# Patient Record
Sex: Female | Born: 1940 | Race: Black or African American | Hispanic: No | Smoking: Never smoker
Health system: Southern US, Community
[De-identification: ages and names within clinical notes are randomized; demographics above are authoritative.]

## PROBLEM LIST (undated history)

## (undated) DIAGNOSIS — G459 Transient cerebral ischemic attack, unspecified: Secondary | ICD-10-CM

## (undated) DIAGNOSIS — I10 Essential (primary) hypertension: Secondary | ICD-10-CM

## (undated) HISTORY — PX: OTHER SURGICAL HISTORY: SHX169

---

## 2016-09-11 ENCOUNTER — Encounter (HOSPITAL_COMMUNITY): Payer: Self-pay | Admitting: Emergency Medicine

## 2016-09-11 ENCOUNTER — Emergency Department (HOSPITAL_COMMUNITY)
Admission: EM | Admit: 2016-09-11 | Discharge: 2016-09-11 | Disposition: A | Discharge status: Home / Self Care | Payer: Medicare Other | Attending: Emergency Medicine | Admitting: Emergency Medicine

## 2016-09-11 ENCOUNTER — Emergency Department (HOSPITAL_COMMUNITY): Payer: Medicare Other

## 2016-09-11 DIAGNOSIS — I10 Essential (primary) hypertension: Secondary | ICD-10-CM | POA: Insufficient documentation

## 2016-09-11 DIAGNOSIS — I951 Orthostatic hypotension: Secondary | ICD-10-CM

## 2016-09-11 DIAGNOSIS — Z8673 Personal history of transient ischemic attack (TIA), and cerebral infarction without residual deficits: Secondary | ICD-10-CM | POA: Insufficient documentation

## 2016-09-11 DIAGNOSIS — R55 Syncope and collapse: Secondary | ICD-10-CM

## 2016-09-11 DIAGNOSIS — Z79899 Other long term (current) drug therapy: Secondary | ICD-10-CM | POA: Insufficient documentation

## 2016-09-11 DIAGNOSIS — Z7982 Long term (current) use of aspirin: Secondary | ICD-10-CM | POA: Diagnosis not present

## 2016-09-11 HISTORY — DX: Transient cerebral ischemic attack, unspecified: G45.9

## 2016-09-11 HISTORY — DX: Essential (primary) hypertension: I10

## 2016-09-11 LAB — CBC
HEMATOCRIT: 28.7 % — AB (ref 36.0–46.0)
HEMOGLOBIN: 9.3 g/dL — AB (ref 12.0–15.0)
MCH: 29.5 pg (ref 26.0–34.0)
MCHC: 32.4 g/dL (ref 30.0–36.0)
MCV: 91.1 fL (ref 78.0–100.0)
Platelets: 130 10*3/uL — ABNORMAL LOW (ref 150–400)
RBC: 3.15 MIL/uL — AB (ref 3.87–5.11)
RDW: 14.4 % (ref 11.5–15.5)
WBC: 5 10*3/uL (ref 4.0–10.5)

## 2016-09-11 LAB — BASIC METABOLIC PANEL
ANION GAP: 10 (ref 5–15)
BUN: 15 mg/dL (ref 6–20)
CALCIUM: 9.1 mg/dL (ref 8.9–10.3)
CO2: 28 mmol/L (ref 22–32)
Chloride: 100 mmol/L — ABNORMAL LOW (ref 101–111)
Creatinine, Ser: 1.19 mg/dL — ABNORMAL HIGH (ref 0.44–1.00)
GFR, EST AFRICAN AMERICAN: 51 mL/min — AB (ref >60)
GFR, EST NON AFRICAN AMERICAN: 44 mL/min — AB (ref >60)
Glucose, Bld: 139 mg/dL — ABNORMAL HIGH (ref 65–99)
POTASSIUM: 3.6 mmol/L (ref 3.5–5.1)
SODIUM: 138 mmol/L (ref 135–145)

## 2016-09-11 MED ORDER — SODIUM CHLORIDE 0.9 % IV BOLUS (SEPSIS)
1000.0000 mL | Freq: Once | INTRAVENOUS | Status: AC
  Administered 2016-09-11: 1000 mL via INTRAVENOUS

## 2016-09-11 NOTE — ED Notes (Signed)
ED Provider at bedside. 

## 2016-09-11 NOTE — ED Notes (Signed)
Bed: WA17 Expected date:  Expected time:  Means of arrival:  Comments: EMS- 75yo F, syncope

## 2016-09-11 NOTE — ED Notes (Signed)
Pt attempted urine sample, was unsuccessful

## 2016-09-11 NOTE — ED Triage Notes (Signed)
Pt from son's home. Pt was taking a shower with an aide when she had a near syncopal episode. Was assisted to sit on toilet at time of near syncope. Pt denies any pain or SOB now or at time of episode. EMS noted that pt was orthostatic upon standing (systolic BP decreased from 140 when sitting to 120 when standing). Pt alert and oriented. Has had other near syncopal episodes at home. Hx of AAA surgery in August. Has not had breakfast or morning medications this am.

## 2016-09-11 NOTE — ED Notes (Signed)
Pt returned from xray

## 2016-09-11 NOTE — ED Notes (Signed)
Pt ambulatory in hallway 

## 2016-09-11 NOTE — ED Notes (Signed)
I made the nurse aware I could not collect the labs

## 2016-09-11 NOTE — ED Notes (Signed)
I attempted to collect labs twice and was unsuccessful 

## 2016-09-11 NOTE — Progress Notes (Signed)
Pt active with [facility] for nursing and PT per Jorja Loaim from [facility]

## 2016-09-11 NOTE — ED Provider Notes (Signed)
[facility]-EMERGENCY DEPT Provider Note   CSN: [id number] Arrival date & time: 09/11/16  [id number]     History   Chief Complaint Chief Complaint  Patient presents with  . Near Syncope    HPI Judith Everett is a 75 y.o. female.  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   Late August had AAA surgery chest[place]abdomen per family in [place] at [facility] with Marcell BarlowYamaguchi    Past Medical History:  Diagnosis Date  . Hypertension   . TIA (transient ischemic attack)    2015?    There are no active problems to display for this patient.   Past Surgical History:  Procedure Laterality Date  . AAA surgery      OB History    No data available       Home Medications    Prior to Admission medications   Medication Sig Start Date End Date Taking? Authorizing Provider  acetaminophen (TYLENOL) 500 MG tablet Take 500 mg by mouth every 6 (six) hours as needed for mild pain.   Yes Historical Provider, MD  aspirin EC 81 MG tablet Take 81 mg by mouth daily.   Yes Historical Provider, MD  atorvastatin (LIPITOR) 20 MG tablet Take 20 mg by mouth daily.   Yes Historical Provider, MD  gabapentin (NEURONTIN) 100 MG capsule Take 100 mg by mouth 3 (three) times daily.   Yes Historical Provider, MD  metoprolol succinate (TOPROL-XL) 25 MG 24 hr tablet Take 25 mg by mouth every morning.   Yes Historical Provider, MD    Family History History  reviewed. No pertinent family history.  Social History Social History  Substance Use Topics  . Smoking status: Never Smoker  . Smokeless tobacco: Never Used  . Alcohol use No     Allergies   Other   Review of Systems Review of Systems  Constitutional: Negative for fever.  HENT: Negative for sore throat.   Eyes: Negative for visual disturbance.  Respiratory: Negative for cough and shortness of breath.   Cardiovascular: Negative for chest pain.  Gastrointestinal: Positive for constipation. Negative for abdominal pain, blood in stool, diarrhea, nausea and vomiting.  Genitourinary: Negative for difficulty urinating.  Musculoskeletal: Positive for back pain. Negative for neck pain.  Skin: Negative for rash.  Neurological: Positive for syncope and light-headedness. Negative for facial asymmetry, speech difficulty, weakness, numbness and headaches.     Physical Exam Updated Vital Signs BP 176/97   Pulse 76   Temp 98 F (36.7 C) (Oral)   Resp 16   SpO2 93%   Physical Exam  Constitutional: She is oriented to person, place, and time. She appears well-developed and well-nourished. No distress.  HENT:  Head: Normocephalic and atraumatic.  Eyes: Conjunctivae and EOM are normal.  Neck: Normal range of motion.  Cardiovascular: Normal rate, regular rhythm, normal heart sounds and intact distal pulses.  Exam reveals no gallop and no  friction rub.   No murmur heard. Pulmonary/Chest: Effort normal and breath sounds normal. No respiratory distress. She has no wheezes. She has no rales.  Lung sounds diminished on the left  Abdominal: Soft. She exhibits no distension. There is no tenderness. There is no guarding.  Musculoskeletal: She exhibits tenderness (left thoracic paraspinal muscles). She exhibits no edema.  Neurological: She is alert and oriented to person, place, and time.  Skin: Skin is warm and dry. No rash noted. She is not diaphoretic. No erythema.  Nursing note and vitals  reviewed.    ED Treatments / Results  Labs (all labs ordered are listed, but only abnormal results are displayed) Labs Reviewed  BASIC METABOLIC PANEL - Abnormal; Notable for the following:       Result Value   Chloride 100 (*)    Glucose, Bld 139 (*)    Creatinine, Ser 1.19 (*)    GFR calc non Af Amer 44 (*)    GFR calc Af Amer 51 (*)    All other components within normal limits  CBC - Abnormal; Notable for the following:    RBC 3.15 (*)    Hemoglobin 9.3 (*)    HCT 28.7 (*)    Platelets 130 (*)    All other components within normal limits  URINALYSIS, ROUTINE W REFLEX MICROSCOPIC  CBG MONITORING, ED    EKG  EKG Interpretation  Date/Time:  Thursday September 11 2016 10:10:16 EST Ventricular Rate:  78 PR Interval:    QRS Duration: 90 QT Interval:  391 QTC Calculation: 446 R Axis:   37 Text Interpretation:  Sinus rhythm Multiple ventricular premature complexes Probable left atrial enlargement Anteroseptal infarct, old No prior ECG available Confirmed by [facility] MD, Anetria Harwick ([id number]) on 09/11/2016 5:56:19 PM       Radiology Dg Chest 2 View  Result Date: 09/11/2016 CLINICAL DATA:  Dizziness, syncope EXAM: CHEST  2 VIEW COMPARISON:  None. FINDINGS: Cardiomegaly. 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 Left retrocardiac opacity, likely compressive atelectasis. Right lung is clear. No visible effusions. No acute bony abnormality. IMPRESSION: Stent graft repair of the thoracic aorta. Cardiomegaly. Left retrocardiac density likely reflects atelectasis. Electronically Signed   By: Charlett Nose M.D.   On: 09/11/2016 12:38    Procedures Procedures (including critical care time)  Medications Ordered in ED Medications  sodium chloride 0.9 % bolus 1,000 mL (0 mLs Intravenous Stopped 09/11/16 1542)     Initial Impression / Assessment and Plan / ED Course  I have reviewed the triage vital signs and the nursing notes.  Pertinent labs & imaging  results that were available during my care of the patient were reviewed by me and considered in my medical decision making (see chart for details).  Clinical Course    75 year old female with a history of aortic aneurysm repair in August with thoracoabdominal stent placed by Dr. Marcell Barlow at [facility], CKD stage III, pericardial effusion, htn presents with concern for syncope.  EKG without significant prolonged QTc, no delta waves, no acute ST changes, no Brugada. Chest x-ray shows cardiomegaly and thoracic stent placement. Mom unable to review previous x-rays, reviewed records from [facility] in [place] where radiology reports prominent heart and mediastinum, prior pericardial effusion seen on CT. No EKG findings or vital sign findings to suggest tamponade, and have low suspicion for this given brief episode of lightheadedness with resolution of symptoms at this time. No symptoms to suggest pneumonia.  Records obtained  and reviewed from [facility] also show patient had previous anemia with hemoglobin of 8.6, with hemoglobin today 9.1 which is improved. Patient denies any other infectious symptoms, including no urinary symptoms. No nausea vomiting, no diarrhea. No black or bloody stool. No chest pain or dyspnea and doubt PE/MI/dissection/aneurysm rupture.   Given clinical setting a patient not having breakfast or water prior to taking a shower this morning, feel her lightheadedness and syncope is likely secondary to dehydration. Patient initially had orthostatic vital sign changes in the emergency department, was given a liter of normal saline with resolution of these changes.  She is asymptomatic in ED.  I feel she is stable for close outpatient follow up and discussed return precautions with her and her son in detail. Patient discharged in stable condition with understanding of reasons to return.   Final Clinical Impressions(s) / ED Diagnoses   Final diagnoses:  Syncope,  unspecified syncope type  Orthostatic hypotension, likely secondary to dehydration, resolved    New Prescriptions Discharge Medication List as of 09/11/2016  3:51 PM       Alvira MondayErin Larine Fielding, MD 09/11/16 [id number]

## 2016-09-12 LAB — CBG MONITORING, ED: GLUCOSE-CAPILLARY: 157 mg/dL — AB (ref 65–99)

## 2017-01-20 ENCOUNTER — Encounter (HOSPITAL_COMMUNITY): Payer: Self-pay

## 2017-01-20 ENCOUNTER — Emergency Department (HOSPITAL_COMMUNITY): Payer: Medicare Other

## 2017-01-20 ENCOUNTER — Emergency Department (HOSPITAL_COMMUNITY)
Admission: EM | Admit: 2017-01-20 | Discharge: 2017-01-20 | Disposition: A | Discharge status: Other Acute Inpatient Hospital | Payer: Medicare Other | Attending: Emergency Medicine | Admitting: Emergency Medicine

## 2017-01-20 DIAGNOSIS — Z7982 Long term (current) use of aspirin: Secondary | ICD-10-CM | POA: Diagnosis not present

## 2017-01-20 DIAGNOSIS — Z8673 Personal history of transient ischemic attack (TIA), and cerebral infarction without residual deficits: Secondary | ICD-10-CM | POA: Diagnosis not present

## 2017-01-20 DIAGNOSIS — T82898A Other specified complication of vascular prosthetic devices, implants and grafts, initial encounter: Secondary | ICD-10-CM | POA: Diagnosis not present

## 2017-01-20 DIAGNOSIS — IMO0002 Reserved for concepts with insufficient information to code with codable children: Secondary | ICD-10-CM

## 2017-01-20 DIAGNOSIS — Z79899 Other long term (current) drug therapy: Secondary | ICD-10-CM | POA: Insufficient documentation

## 2017-01-20 DIAGNOSIS — R1013 Epigastric pain: Secondary | ICD-10-CM | POA: Diagnosis not present

## 2017-01-20 DIAGNOSIS — I169 Hypertensive crisis, unspecified: Secondary | ICD-10-CM | POA: Insufficient documentation

## 2017-01-20 DIAGNOSIS — T82330A Leakage of aortic (bifurcation) graft (replacement), initial encounter: Secondary | ICD-10-CM

## 2017-01-20 DIAGNOSIS — Y69 Unspecified misadventure during surgical and medical care: Secondary | ICD-10-CM | POA: Diagnosis not present

## 2017-01-20 DIAGNOSIS — R072 Precordial pain: Secondary | ICD-10-CM | POA: Diagnosis present

## 2017-01-20 LAB — CBC WITH DIFFERENTIAL/PLATELET
BASOS PCT: 0 %
Basophils Absolute: 0 10*3/uL (ref 0.0–0.1)
EOS ABS: 0.1 10*3/uL (ref 0.0–0.7)
Eosinophils Relative: 1 %
HEMATOCRIT: 29.6 % — AB (ref 36.0–46.0)
HEMOGLOBIN: 9.2 g/dL — AB (ref 12.0–15.0)
LYMPHS ABS: 1.8 10*3/uL (ref 0.7–4.0)
LYMPHS PCT: 23 %
MCH: 29.6 pg (ref 26.0–34.0)
MCHC: 31.1 g/dL (ref 30.0–36.0)
MCV: 95.2 fL (ref 78.0–100.0)
Monocytes Absolute: 0.8 10*3/uL (ref 0.1–1.0)
Monocytes Relative: 10 %
NEUTROS ABS: 5.2 10*3/uL (ref 1.7–7.7)
Neutrophils Relative %: 66 %
Platelets: 98 10*3/uL — ABNORMAL LOW (ref 150–400)
RBC: 3.11 MIL/uL — ABNORMAL LOW (ref 3.87–5.11)
RDW: 14.1 % (ref 11.5–15.5)
WBC: 7.9 10*3/uL (ref 4.0–10.5)

## 2017-01-20 LAB — COMPREHENSIVE METABOLIC PANEL
ALK PHOS: 73 U/L (ref 38–126)
ALT: 12 U/L — ABNORMAL LOW (ref 14–54)
ANION GAP: 10 (ref 5–15)
AST: 22 U/L (ref 15–41)
Albumin: 4.2 g/dL (ref 3.5–5.0)
BILIRUBIN TOTAL: 0.8 mg/dL (ref 0.3–1.2)
BUN: 20 mg/dL (ref 6–20)
CALCIUM: 9.4 mg/dL (ref 8.9–10.3)
CO2: 27 mmol/L (ref 22–32)
Chloride: 104 mmol/L (ref 101–111)
Creatinine, Ser: 1.4 mg/dL — ABNORMAL HIGH (ref 0.44–1.00)
GFR calc non Af Amer: 36 mL/min — ABNORMAL LOW (ref >60)
GFR, EST AFRICAN AMERICAN: 41 mL/min — AB (ref >60)
Glucose, Bld: 168 mg/dL — ABNORMAL HIGH (ref 65–99)
Potassium: 3.6 mmol/L (ref 3.5–5.1)
Sodium: 141 mmol/L (ref 135–145)
TOTAL PROTEIN: 7.6 g/dL (ref 6.5–8.1)

## 2017-01-20 LAB — ABO/RH: ABO/RH(D): O POS

## 2017-01-20 LAB — TYPE AND SCREEN
ABO/RH(D): O POS
ANTIBODY SCREEN: NEGATIVE

## 2017-01-20 LAB — PROTIME-INR
INR: 1.37
Prothrombin Time: 17 seconds — ABNORMAL HIGH (ref 11.4–15.2)

## 2017-01-20 LAB — I-STAT CHEM 8, ED
BUN: 20 mg/dL (ref 6–20)
Calcium, Ion: 1.14 mmol/L — ABNORMAL LOW (ref 1.15–1.40)
Chloride: 104 mmol/L (ref 101–111)
Creatinine, Ser: 1.5 mg/dL — ABNORMAL HIGH (ref 0.44–1.00)
GLUCOSE: 168 mg/dL — AB (ref 65–99)
HEMATOCRIT: 30 % — AB (ref 36.0–46.0)
HEMOGLOBIN: 10.2 g/dL — AB (ref 12.0–15.0)
POTASSIUM: 3.6 mmol/L (ref 3.5–5.1)
Sodium: 141 mmol/L (ref 135–145)
TCO2: 28 mmol/L (ref 0–100)

## 2017-01-20 LAB — APTT: APTT: 27 s (ref 24–36)

## 2017-01-20 LAB — MAGNESIUM: Magnesium: 2.2 mg/dL (ref 1.7–2.4)

## 2017-01-20 LAB — I-STAT TROPONIN, ED
Troponin i, poc: 0 ng/mL (ref 0.00–0.08)
Troponin i, poc: 0.01 ng/mL (ref 0.00–0.08)

## 2017-01-20 MED ORDER — ESMOLOL HCL-SODIUM CHLORIDE 2000 MG/100ML IV SOLN
25.0000 ug/kg/min | Freq: Once | INTRAVENOUS | Status: AC
  Administered 2017-01-20: 56.497 ug/kg/min via INTRAVENOUS
  Filled 2017-01-20: qty 100

## 2017-01-20 MED ORDER — IOPAMIDOL (ISOVUE-370) INJECTION 76%
INTRAVENOUS | Status: AC
  Filled 2017-01-20: qty 100

## 2017-01-20 MED ORDER — METOPROLOL TARTRATE 5 MG/5ML IV SOLN
10.0000 mg | Freq: Once | INTRAVENOUS | Status: AC
  Administered 2017-01-20: 10 mg via INTRAVENOUS
  Filled 2017-01-20: qty 10

## 2017-01-20 MED ORDER — IOPAMIDOL (ISOVUE-370) INJECTION 76%
100.0000 mL | Freq: Once | INTRAVENOUS | Status: AC | PRN
  Administered 2017-01-20: 100 mL via INTRAVENOUS

## 2017-01-20 NOTE — ED Provider Notes (Signed)
[facility]-EMERGENCY DEPT Provider Note   CSN: [id number] Arrival date & time: 01/20/17  1121     History   Chief Complaint Chief Complaint  Patient presents with  . Chest Pain  . Weakness    HPI Judith Everett is a 76 y.o. female.  The history is provided by the patient.  Chest Pain   This is a new problem. The current episode started 1 to 2 hours ago. The problem occurs constantly. The problem has been rapidly worsening. The pain is associated with rest. The pain is present in the substernal region. The pain is severe. The quality of the pain is described as sharp and stabbing (tearing). The pain radiates to the epigastrium. Duration of episode(s) is 10 minutes. Pertinent negatives include no abdominal pain. She has tried nothing for the symptoms. The treatment provided no relief.    Past Medical History:  Diagnosis Date  . Hypertension   . TIA (transient ischemic attack)    2015?    There are no active problems to display for this patient.   Past Surgical History:  Procedure Laterality Date  . AAA surgery      OB History    No data available       Home Medications    Prior to Admission medications   Medication Sig Start Date End Date Taking? Authorizing Provider  acetaminophen (TYLENOL) 500 MG tablet Take 500 mg by mouth every 6 (six) hours as needed for mild pain.    Historical Provider, MD  aspirin EC 81 MG tablet Take 81 mg by mouth daily.    Historical Provider, MD  atorvastatin (LIPITOR) 20 MG tablet Take 20 mg by mouth daily.    Historical Provider, MD  gabapentin (NEURONTIN) 100 MG capsule Take 100 mg by mouth 3 (three) times daily.    Historical Provider, MD  metoprolol succinate (TOPROL-XL) 25 MG 24 hr tablet Take 25 mg by mouth every morning.    Historical Provider, MD    Family History No family history on file.  Social History Social History  Substance Use Topics  . Smoking status: Never Smoker  . Smokeless tobacco: Never Used  . Alcohol use No      Allergies   Other   Review of Systems Review of Systems  Cardiovascular: Positive for chest pain.  Gastrointestinal: Negative for abdominal pain.  All other systems reviewed and are negative.    Physical Exam Updated Vital Signs BP 108/74 (BP Location: Left Arm)   Pulse (!) 102   Temp 98.3 F (36.8 C) (Oral)   Resp 18   Ht  (1.575 m)   Wt 130 lb (59 kg)   SpO2 100%   BMI 23.78 kg/m   Physical Exam  Constitutional: She is oriented to person, place, and time. She appears well-developed. She appears lethargic. She appears toxic. She appears distressed.  HENT:  Head: Normocephalic.  Nose: Nose normal.  Eyes: Conjunctivae are normal.  Neck: Neck supple. No tracheal deviation present.  Cardiovascular: Regular rhythm and normal heart sounds.  Tachycardia present.   Pulmonary/Chest: Effort normal and breath sounds normal. No respiratory distress.  Abdominal: Soft. She exhibits no distension. There is tenderness in the epigastric area and periumbilical area. There is guarding. There is no rigidity and no rebound.  Neurological: She is oriented to person, place, and time. She appears lethargic. GCS eye subscore is 4. GCS verbal subscore is 5. GCS motor subscore is 6.  Skin: Skin is warm. She is diaphoretic.  Psychiatric: She has a normal mood and affect.  Vitals reviewed.    ED Treatments / Results  Labs (all labs ordered are listed, but only abnormal results are displayed) Labs Reviewed  PROTIME-INR - Abnormal; Notable for the following:       Result Value   Prothrombin Time 17.0 (*)    All other components within normal limits  CBC WITH DIFFERENTIAL/PLATELET - Abnormal; Notable for the following:    RBC 3.11 (*)    Hemoglobin 9.2 (*)    HCT 29.6 (*)    Platelets 98 (*)    All other components within normal limits  COMPREHENSIVE METABOLIC PANEL - Abnormal; Notable for the following:    Glucose, Bld 168 (*)    Creatinine, Ser 1.40 (*)    ALT 12 (*)     GFR calc non Af Amer 36 (*)    GFR calc Af Amer 41 (*)    All other components within normal limits  I-STAT CHEM 8, ED - Abnormal; Notable for the following:    Creatinine, Ser 1.50 (*)    Glucose, Bld 168 (*)    Calcium, Ion 1.14 (*)    Hemoglobin 10.2 (*)    HCT 30.0 (*)    All other components within normal limits  APTT  MAGNESIUM  I-STAT TROPOININ, ED  I-STAT TROPOININ, ED  TYPE AND SCREEN  ABO/RH    EKG  EKG Interpretation  Date/Time:  Tuesday January 20 2017 12:10:17 EDT Ventricular Rate:  99 PR Interval:    QRS Duration: 76 QT Interval:  383 QTC Calculation: 492 R Axis:   66 Text Interpretation:  Sinus tachycardia Multiple premature complexes, vent & supraven Probable left atrial enlargement Abnormal R-wave progression, early transition Borderline prolonged QT interval Confirmed by Tabbetha Kutscher MD, Reuel Boom ([id number]) on 01/20/2017 12:19:45 PM Also confirmed by Natoria Archibald MD, Geryl Dohn [phone]), editor Misty Stanley [phone])  on 01/20/2017 1:33:26 PM       Radiology Dg Chest Port 1 View  Result Date: 01/20/2017 CLINICAL DATA:  Diaphoretic.  Shortness of breath EXAM: PORTABLE CHEST 1 VIEW COMPARISON:  09/11/2016 FINDINGS: Chronic cardiopericardial enlargement, aortic tortuosity, and aortic enlargement. Unchanged appearance of aortic stent graft. Improved aeration at the left base compared to prior. No edema, effusion, or pneumothorax. IMPRESSION: No acute finding when compared to prior. Unchanged radiographic appearance of cardiopericardial enlargement and stented aneurysmal aorta. Electronically Signed   By: Marnee Spring M.D.   On: 01/20/2017 12:29   Ct Angio Chest/abd/pel For Dissection W And/or Wo Contrast  Result Date: 01/20/2017 CLINICAL DATA:  Chest pain EXAM: CT ANGIOGRAPHY CHEST, ABDOMEN AND PELVIS TECHNIQUE: Multidetector CT imaging through the chest, abdomen and pelvis was performed using the standard protocol during bolus administration of intravenous contrast. Multiplanar  reconstructed images and MIPs were obtained and reviewed to evaluate the vascular anatomy. CONTRAST:  100 cc Isovue 370 COMPARISON:  None. FINDINGS: CTA CHEST FINDINGS Cardiovascular: Aneurysmal dilatation of the descending thoracic aorta is noted. A stent graft is in place extending from the proximal descending thoracic aorta into the abdominal aorta. It extends from just beyond the origin of the left subclavian artery. Maximal diameter of the aneurysm sac is 6.3 cm. The lumen of the stent graft is patent. There is avid contrast enhancement within the aneurysm sac surrounding the stent graft material consistent with endoleak. A large portion of the aneurysm sac is enhancing including both the thoracic and abdominal portions. Concentration of the contrast is nearly equal to that of blood within the stent  graft. This suggests arterial pressure within the aneurysm sac most likely due to a type 3 endoleak. Contrast is not seen surrounding the proximal extent or distal extent of the stent grafts. Several gates have been created for the visceral vasculature. The innominate artery, right subclavian artery, right common carotid artery, left common carotid artery are patent. There is mild narrowing at the origin of the left subclavian artery likely related to geometry of the aorta. It is otherwise patent. Left axillary artery is patent. The ascending aorta is non aneurysmal. A large pericardial effusion is present there is no evidence of hemopericardium or rupture of the outer aneurysm sac. Mediastinum/Nodes: There is no abnormal mediastinal adenopathy. Lungs/Pleura: Subsegmental atelectasis at the left lung base. A small left pleural effusion is noted. Musculoskeletal: No acute vertebral compression deformity. Review of the MIP images confirms the above findings. CTA ABDOMEN AND PELVIS FINDINGS VASCULAR Aorta: A thoracoabdominal aneurysm extends from the descending aorta into the abdomen. Maximal diameter at the  diaphragmatic hiatus is 6.2 cm. The stent graft extends into the infrarenal abdominal aorta. Several stent graft gates have been created for the visceral vasculature, including the celiac axis, SMA, and bilateral single renal arteries. There is avid enhancement of contrast within the aneurysm sac surrounding the stent graft material circumferentially suggesting arterial pressure within the aneurysm sac. There is no evidence of contrast enhancement around the distal end of the stent graft, therefore this is most likely due to a type 3 endoleak or leakage at a gate or overlapping stent. There is no evidence of overt rupture or retroperitoneal hemorrhage. There is no evidence of an aortic dissection flap. Celiac: Stent graft in place. Patent vessel. There is irregular lobulated enhancing vascular structure anterior to the aorta in the expected location of an accessory left hepatic artery. It measures 0.8 x 1.2 cm. SMA: Stent graft in place.  Patent vessel. Renals: Bilateral single renal artery stent grafts are in place. The vessels are patent. IMA: Occluded.  Branch vessels reconstitute. Inflow: Right common iliac artery is patent. There is a serpiginous linear flap in the proximal right external iliac artery. Contrast enhancement beyond this area is avid therefore this is most consistent with a non flow limiting dissection. Right internal iliac artery is patent. Left common, internal, and external iliac arteries are patent. Review of the MIP images confirms the above findings. NON-VASCULAR Hepatobiliary: Liver enhances homogeneously without focal mass. Normal gallbladder. Pancreas: Unremarkable Spleen: Unremarkable Adrenals/Urinary Tract: There are chronic changes involving the right kidney with atrophy. Less prominent chronic changes in the upper pole of the left kidney. Tiny calculus in the upper pole of the left kidney adrenal glands are within normal limits. Bladder is unremarkable. Stomach/Bowel: Stomach is  distended with enteric contents. Prominent stool burden throughout the colon. No evidence of small-bowel obstruction. Lymphatic: No evidence of abnormal retroperitoneal adenopathy. Reproductive: Uterus is absent.  Adnexa are within normal limits. Other: Small amount of free fluid is layering in the dependent pelvis. Musculoskeletal: No vertebral compression deformity. Review of the MIP images confirms the above findings. IMPRESSION: A thoraco-abdominal stent graft is in place in the treatment of a complex thoraco abdominal aortic aneurysm. There is avid enhancement within the aneurysm sac surrounding the stent graft material. Findings are most consistent with a type 3 endoleak secondary to one of the gates or stent overlap locations. There are gates at the celiac, SMA, right renal artery, and left renal artery. Maximal sac diameters in the chest and abdomen are 6.3 cm and  6.2 cm respectively. 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 Critical Value/emergent results were called by telephone at the time of interpretation on 01/20/2017 at 1:50 pm to Dr. Rhona Raider, PA, who verbally acknowledged these results. Electronically Signed   By: Jolaine Click M.D.   On: 01/20/2017 13:55    Procedures Procedures (including critical care time)  CRITICAL CARE Performed by: Lyndal Pulley Total critical care time: 45 minutes Critical care time was exclusive of separately billable procedures and treating other patients. Critical care was necessary to treat or prevent imminent or life-threatening deterioration. Critical care was time spent personally by me on the following activities: development of treatment plan with patient and/or surrogate as well as nursing, discussions with consultants, evaluation of patient's response to treatment, examination of patient, obtaining history from patient or surrogate, ordering and  performing treatments and interventions, ordering and review of laboratory studies, ordering and review of radiographic studies, pulse oximetry and re-evaluation of patient's condition.    EMERGENCY DEPARTMENT [place] CARDIAC EXAM "Study: Limited Ultrasound of the Heart and Pericardium"  INDICATIONS:Abnormal vital signs and Chest pain Multiple views of the heart and pericardium were obtained in real-time with a multi-frequency probe.  PERFORMED XB:JYNWGN IMAGES ARCHIVED?: No LIMITATIONS:  Emergent procedure VIEWS USED: Subcostal 4 chamber, Parasternal long axis, Parasternal short axis and Inferior Vena Cava INTERPRETATION: Cardiac activity present, Pericardial effusion present, Amount of pericardial effusion : moderate, Cardiac tamponade absent, Normal contractility, IVC dilated and notable aortic graft within thoracic aortic aneurysm lumen  EMERGENCY DEPARTMENT ULTRASOUND  Study: Limited Retroperitoneal Ultrasound of the Abdominal Aorta.  INDICATIONS:Abnormal vital signs Multiple views of the abdominal aorta were obtained in real-time from the diaphragmatic hiatus to the aortic bifurcation in transverse planes with a multi-frequency probe.  PERFORMED BY: Myself IMAGES ARCHIVED?: Yes LIMITATIONS:  Body habitus and Abdominal pain INTERPRETATION:  Abdominal aortic aneurysm present - diameter dimensions 6x5 cm  Medications Ordered in ED Medications  esmolol (BREVIBLOC) 2000 mg / 100 mL (20 mg/mL) infusion (0 mcg/kg/min  59 kg Intravenous Stopped 01/20/17 1650)  metoprolol (LOPRESSOR) injection 10 mg (10 mg Intravenous Given 01/20/17 1226)  iopamidol (ISOVUE-370) 76 % injection 100 mL (100 mLs Intravenous Contrast Given 01/20/17 1253)     Initial Impression / Assessment and Plan / ED Course  I have reviewed the triage vital signs and the nursing notes.  Pertinent labs & imaging results that were available during my care of the patient were reviewed by me and considered in my medical decision  making (see chart for details).     76 y.o. female presents with sudden onset chest and upper abdominal pain that feels sharp and tearing towards her back. She has a history of a complex aortic graft with known pericardial effusion. She appears very ill on arrival, is diaphoretic and weak with abdominal tenderness. She is profoundly hypertensive and appears to be decompensating. Bedside ultrasound showed aortic aneurysm with graft in place and enlarged false lumen. Clinical concern for dissection versus graft endoleak versus rupturing aneurysm. Rate and blood pressure control was initiated with Lopressor while awaiting esmolol infusion from pharmacy. Esmolol was able to appropriately control her symptoms which resolved and her heart rate and blood pressure have lowered appropriately.  I discussed the case with Dr. Myra Gianotti of vascular surgery who reviewed the images with radiology and he is concerned that the graft is too complicated for intervention by our vascular team here  and she would benefit from transfer to tertiary care center where she had this custom device implanted. I discussed the case with Dr. Abel Presto of vascular surgery at [facility] and reviewed their images and explained that her endoleak appears stable but recommended medical management.  I was told they had difficulty finding a bed at the ICU in [place] so the patient may have to board here overnight. I discussed with critical care who wanted input from vascular surgery and Dr. Myra Gianotti strongly recommended the patient not stay here any longer than absolutely necessary and he care from her primary surgical team. Critical care accepting doctor is [place] per transfer line. :Patient for now remains asymptomatic and has been appropriately stabilized.  Final Clinical Impressions(s) / ED Diagnoses   Final diagnoses:  Endoleak of aortic graft (HCC)  Hypertensive crisis without congestive heart failure    New Prescriptions New Prescriptions     No medications on file     Lyndal Pulley, MD 01/20/17 1707

## 2017-01-20 NOTE — ED Notes (Signed)
Gave report  to [employer], CRN. At [facility], accepting MD is Dr Jeannine Boga. Bed 201-C at [facility] at [facility]. Phone number [phone]. Jason Nest sts they wil arrange transport by air if weather allows it.

## 2017-01-28 ENCOUNTER — Encounter: Payer: Self-pay | Admitting: Oncology

## 2017-01-28 ENCOUNTER — Telehealth: Payer: Self-pay | Admitting: Oncology

## 2017-01-28 NOTE — Telephone Encounter (Signed)
Attempted to contact the pt, but phone straight to vm. Scheduled the pt to see Dr. Clelia Croft on 5/31 at 2pm. Will mail the pt a letter and fax appt date and time to the referring office.

## 2017-02-26 ENCOUNTER — Encounter: Payer: Medicare Other | Admitting: Oncology

## 2017-03-29 DEATH — deceased

## 2017-10-21 IMAGING — DX DG CHEST 1V PORT
1 series · 1 of 1 positions shown · non-contrast
Comparison: 09/11/2016

CLINICAL DATA: Diaphoretic.  Shortness of breath

EXAM:
PORTABLE CHEST 1 VIEW

[chest ap]
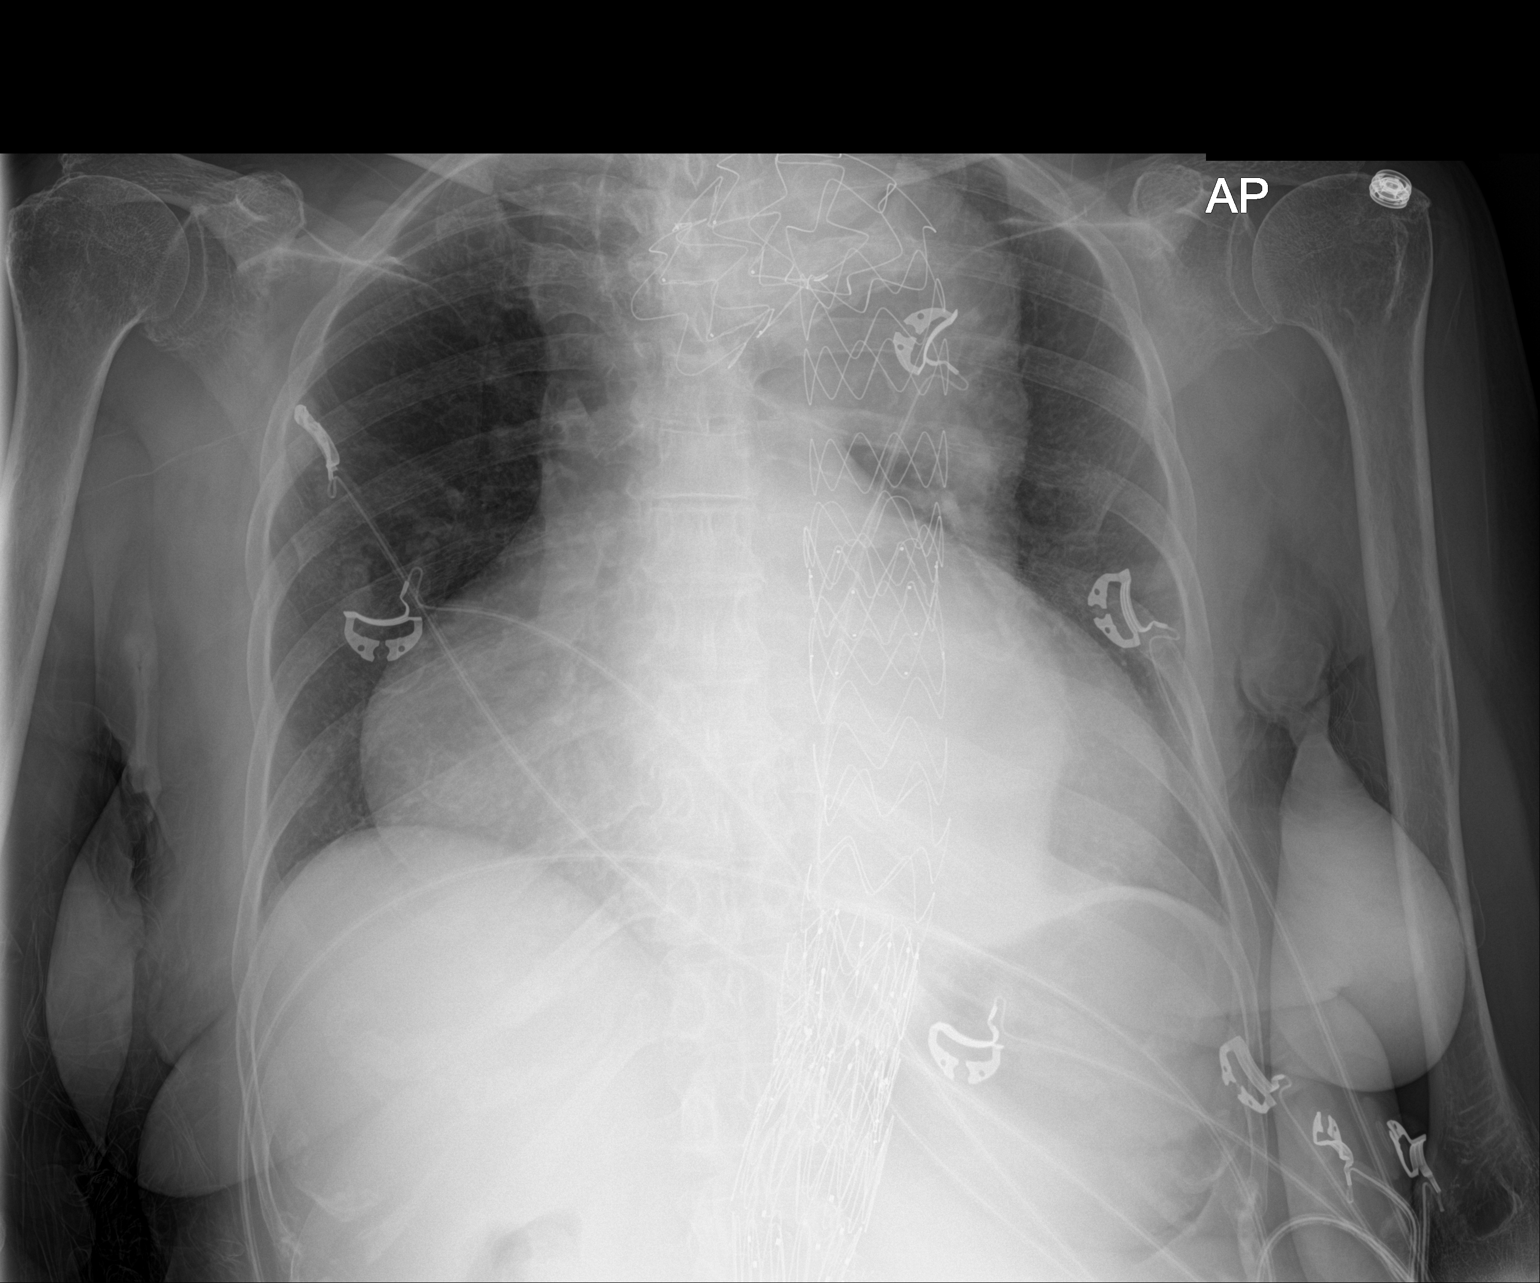

[1 of 1 positions shown; findings below may reference images not displayed]

FINDINGS: Chronic cardiopericardial enlargement, aortic tortuosity, and aortic
enlargement. Unchanged appearance of aortic stent graft. Improved
aeration at the left base compared to prior. No edema, effusion, or
pneumothorax.
IMPRESSION: No acute finding when compared to prior. Unchanged radiographic
appearance of cardiopericardial enlargement and stented aneurysmal
aorta.

## 2017-10-21 IMAGING — CT CT ANGIO CHEST-ABD-PELV FOR DISSECTION W/ AND WO/W CM
2 of 7 series · 13 of 46 positions shown, 15 images · IV contrast (ISOVUE 370)
Comparison: None.

CLINICAL DATA: Chest pain

EXAM:
CT ANGIOGRAPHY CHEST, ABDOMEN AND PELVIS
TECHNIQUE: Multidetector CT imaging through the chest, abdomen and pelvis was
performed using the standard protocol during bolus administration of
intravenous contrast. Multiplanar reconstructed images and MIPs were
obtained and reviewed to evaluate the vascular anatomy.
CONTRAST:  100 cc Isovue 370

[Series 5: arterial 3.0 b30f · axial · arterial · 0.71mm/px · z∈[+1136,+1658]mm · 10 of 198 slices shown, 12 images]
[im 12/198  soft-tissue]
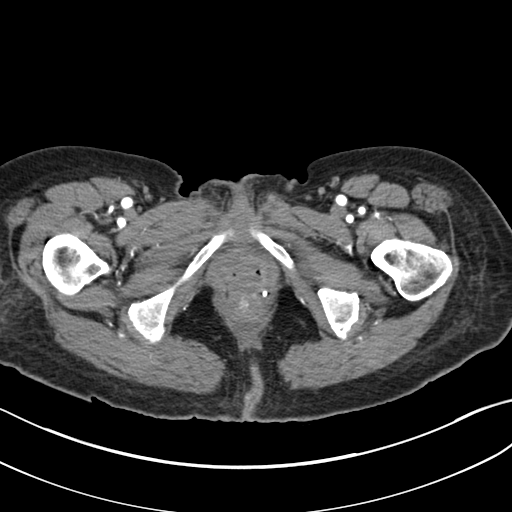
[im 12/198  bone]
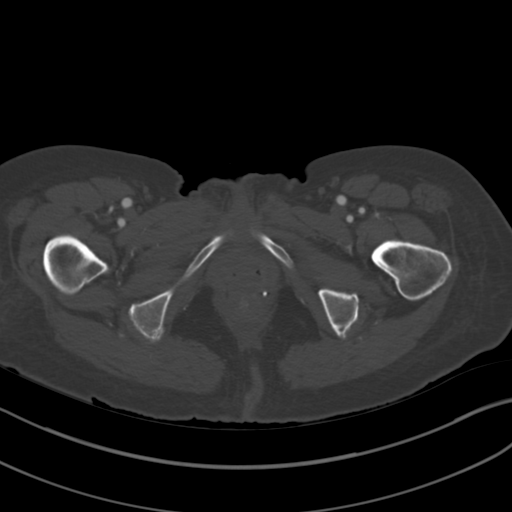
[im 35/198  soft-tissue]
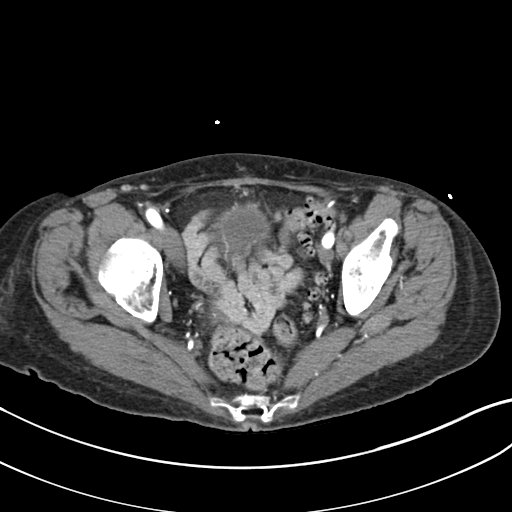
[im 58/198  soft-tissue]
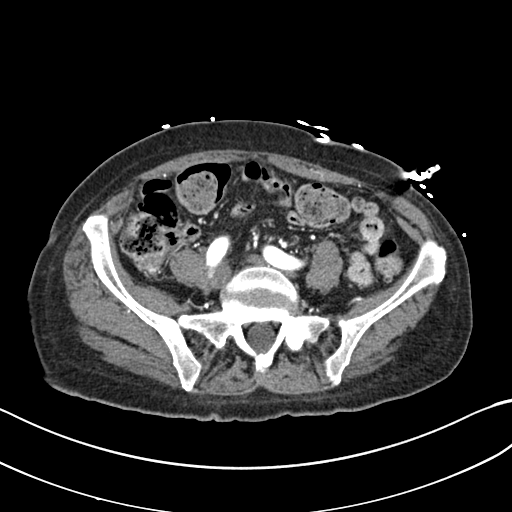
[im 70/198  soft-tissue]
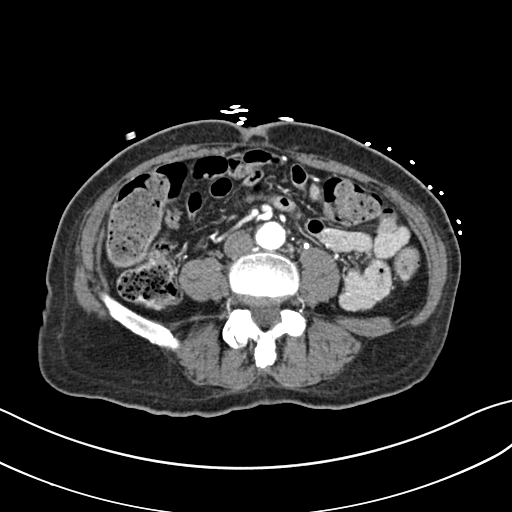
[im 93/198  soft-tissue]
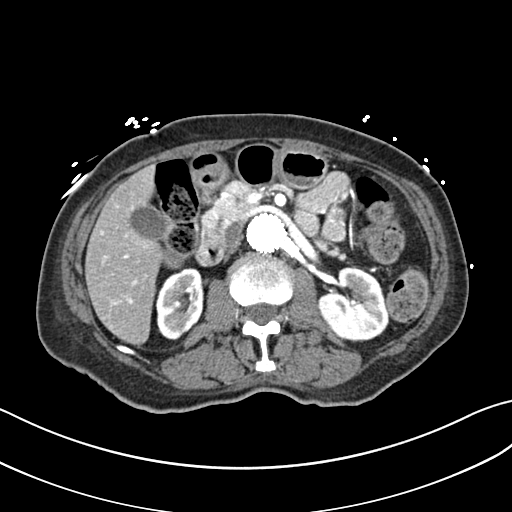
[im 105/198  soft-tissue]
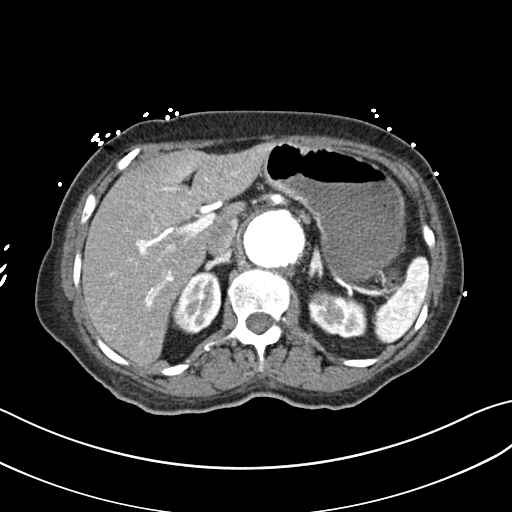
[im 128/198  soft-tissue]
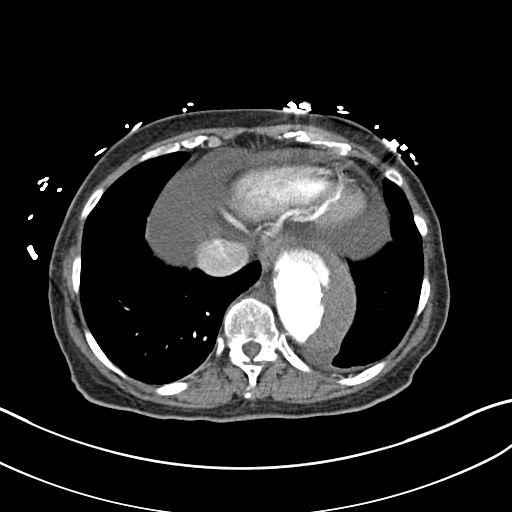
[im 151/198  soft-tissue]
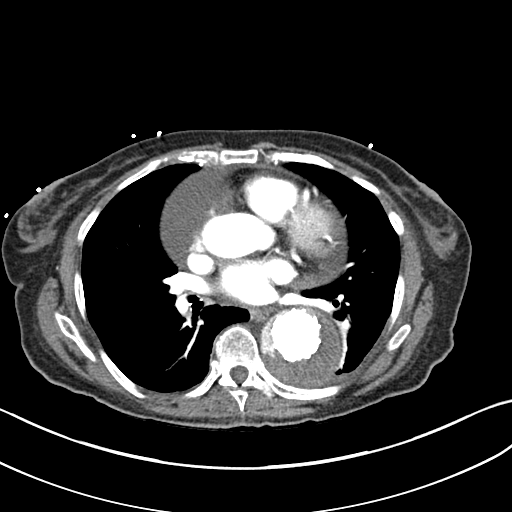
[im 163/198  soft-tissue]
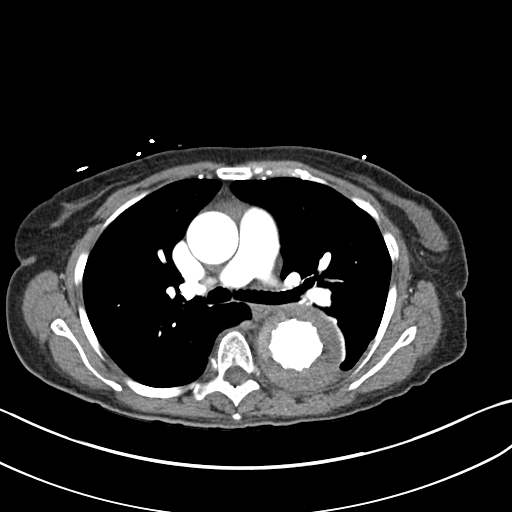
[im 163/198  bone]
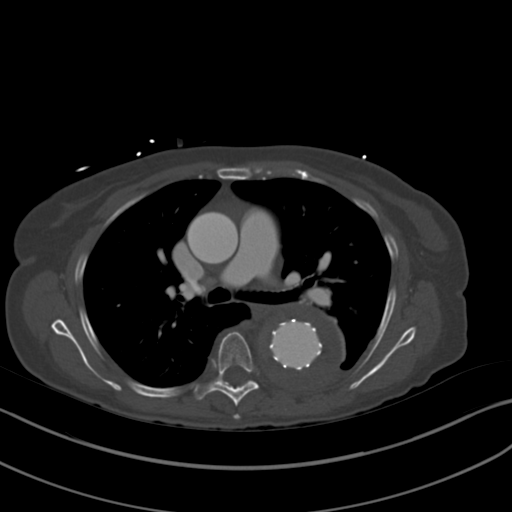
[im 186/198  soft-tissue]
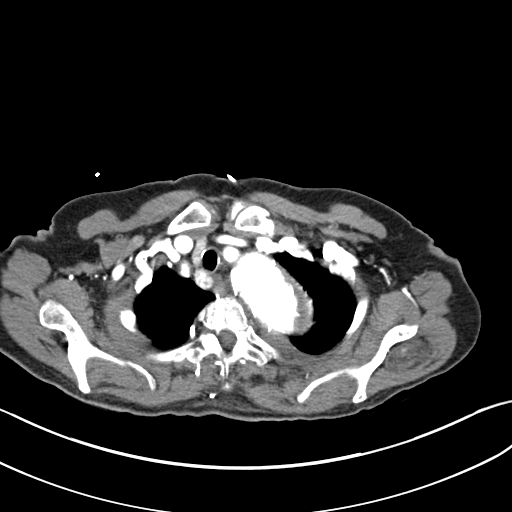

[Series 8: coronal mpr · coronal · 0.87mm/px · 3 of 124 slices shown]
[im 31/124  soft-tissue]
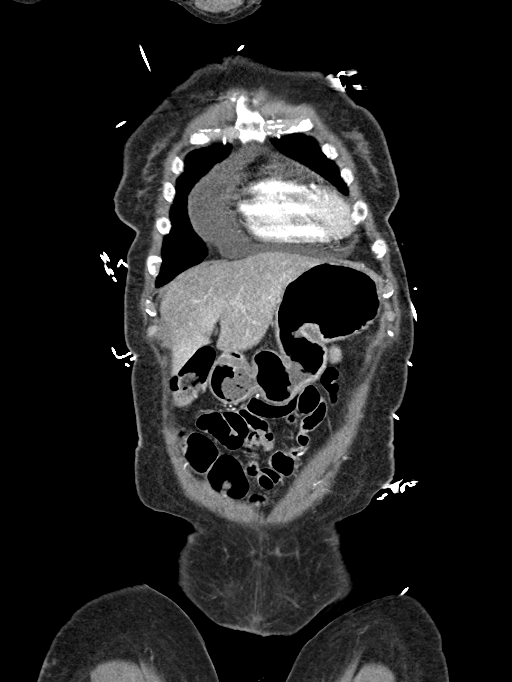
[im 62/124  soft-tissue]
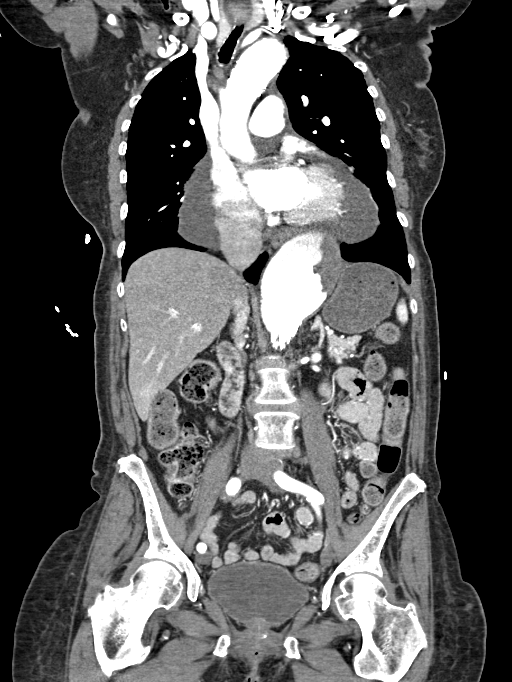
[im 93/124  soft-tissue]
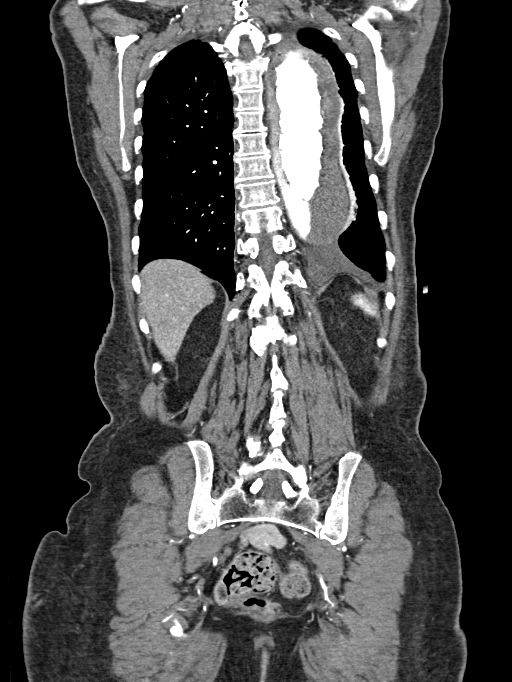

[13 of 46 positions shown; findings below may reference images not displayed]

FINDINGS: CTA CHEST FINDINGS

Cardiovascular: Aneurysmal dilatation of the descending thoracic
aorta is noted. A stent graft is in place extending from the
proximal descending thoracic aorta into the abdominal aorta. It
extends from just beyond the origin of the left subclavian artery.
Maximal diameter of the aneurysm sac is 6.3 cm. The lumen of the
stent graft is patent. There is avid contrast enhancement within the
aneurysm sac surrounding the stent graft material consistent with
endoleak. A large portion of the aneurysm sac is enhancing including
both the thoracic and abdominal portions. Concentration of the
contrast is nearly equal to that of blood within the stent graft.
This suggests arterial pressure within the aneurysm sac most likely
due to a type 3 endoleak. Contrast is not seen surrounding the
proximal extent or distal extent of the stent grafts. Several gates
have been created for the visceral vasculature.

The innominate artery, right subclavian artery, right common carotid
artery, left common carotid artery are patent. There is mild
narrowing at the origin of the left subclavian artery likely related
to geometry of the aorta. It is otherwise patent. Left axillary
artery is patent.

The ascending aorta is non aneurysmal.

A large pericardial effusion is present there is no evidence of
hemopericardium or rupture of the outer aneurysm sac.

Mediastinum/Nodes: There is no abnormal mediastinal adenopathy.

Lungs/Pleura: Subsegmental atelectasis at the left lung base. A
small left pleural effusion is noted.

Musculoskeletal: No acute vertebral compression deformity.

Review of the MIP images confirms the above findings.

CTA ABDOMEN AND PELVIS FINDINGS

VASCULAR

Aorta: A thoracoabdominal aneurysm extends from the descending aorta
into the abdomen. Maximal diameter at the diaphragmatic hiatus is
6.2 cm. The stent graft extends into the infrarenal abdominal aorta.
Several stent graft gates have been created for the visceral
vasculature, including the celiac axis, SMA, and bilateral single
renal arteries. There is avid enhancement of contrast within the
aneurysm sac surrounding the stent graft material circumferentially
suggesting arterial pressure within the aneurysm sac. There is no
evidence of contrast enhancement around the distal end of the stent
graft, therefore this is most likely due to a type 3 endoleak or
leakage at a gate or overlapping stent. There is no evidence of
overt rupture or retroperitoneal hemorrhage. There is no evidence of
an aortic dissection flap.

Celiac: Stent graft in place. Patent vessel. There is irregular
lobulated enhancing vascular structure anterior to the aorta in the
expected location of an accessory left hepatic artery. It measures
0.8 x 1.2 cm.

SMA: Stent graft in place.  Patent vessel.

Renals: Bilateral single renal artery stent grafts are in place. The
vessels are patent.

IMA: Occluded.  Branch vessels reconstitute.

Inflow: Right common iliac artery is patent. There is a serpiginous
linear flap in the proximal right external iliac artery. Contrast
enhancement beyond this area is avid therefore this is most
consistent with a non flow limiting dissection. Right internal iliac
artery is patent. Left common, internal, and external iliac arteries
are patent.

Review of the MIP images confirms the above findings.

NON-VASCULAR

Hepatobiliary: Liver enhances homogeneously without focal mass.
Normal gallbladder.

Pancreas: Unremarkable

Spleen: Unremarkable

Adrenals/Urinary Tract: There are chronic changes involving the
right kidney with atrophy. Less prominent chronic changes in the
upper pole of the left kidney. Tiny calculus in the upper pole of
the left kidney adrenal glands are within normal limits. Bladder is
unremarkable.

Stomach/Bowel: Stomach is distended with enteric contents. Prominent
stool burden throughout the colon. No evidence of small-bowel
obstruction.

Lymphatic: No evidence of abnormal retroperitoneal adenopathy.

Reproductive: Uterus is absent.  Adnexa are within normal limits.

Other: Small amount of free fluid is layering in the dependent
pelvis.

Musculoskeletal: No vertebral compression deformity.

Review of the MIP images confirms the above findings.
IMPRESSION: A thoraco-abdominal stent graft is in place in the treatment of a
complex thoraco abdominal aortic aneurysm. There is avid enhancement
within the aneurysm sac surrounding the stent graft material.
Findings are most consistent with a type 3 endoleak secondary to one
of the gates or stent overlap locations. There are gates at the
celiac, SMA, right renal artery, and left renal artery. Maximal sac
diameters in the chest and abdomen are 6.3 cm and 6.2 cm
respectively.

1.2 cm pseudoaneurysm in the location of the accessory left hepatic
artery.

Non flow limiting dissection in the right external iliac artery.

Critical Value/emergent results were called by telephone at the time
of interpretation on 01/20/2017 at [DATE] to Dr. Phytos Bianco,
PA, who verbally acknowledged these results.
